# Patient Record
Sex: Male | Born: 2000 | Race: White | Hispanic: Yes | Marital: Single | Smoking: Never smoker
Health system: Southern US, Community
[De-identification: ages and names within clinical notes are randomized; demographics above are authoritative.]

## PROBLEM LIST (undated history)

## (undated) DIAGNOSIS — R109 Unspecified abdominal pain: Secondary | ICD-10-CM

## (undated) DIAGNOSIS — K5900 Constipation, unspecified: Secondary | ICD-10-CM

## (undated) HISTORY — DX: Unspecified abdominal pain: R10.9

## (undated) HISTORY — DX: Constipation, unspecified: K59.00

---
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
## 2018-07-08 IMAGING — CR DG FOOT COMPLETE 3+V*R*
3 series · 3 of 3 positions shown · non-contrast
Comparison: No recent.

CLINICAL DATA: Injury.

EXAM:
RIGHT FOOT COMPLETE - 3+ VIEW

[x foot ap right]
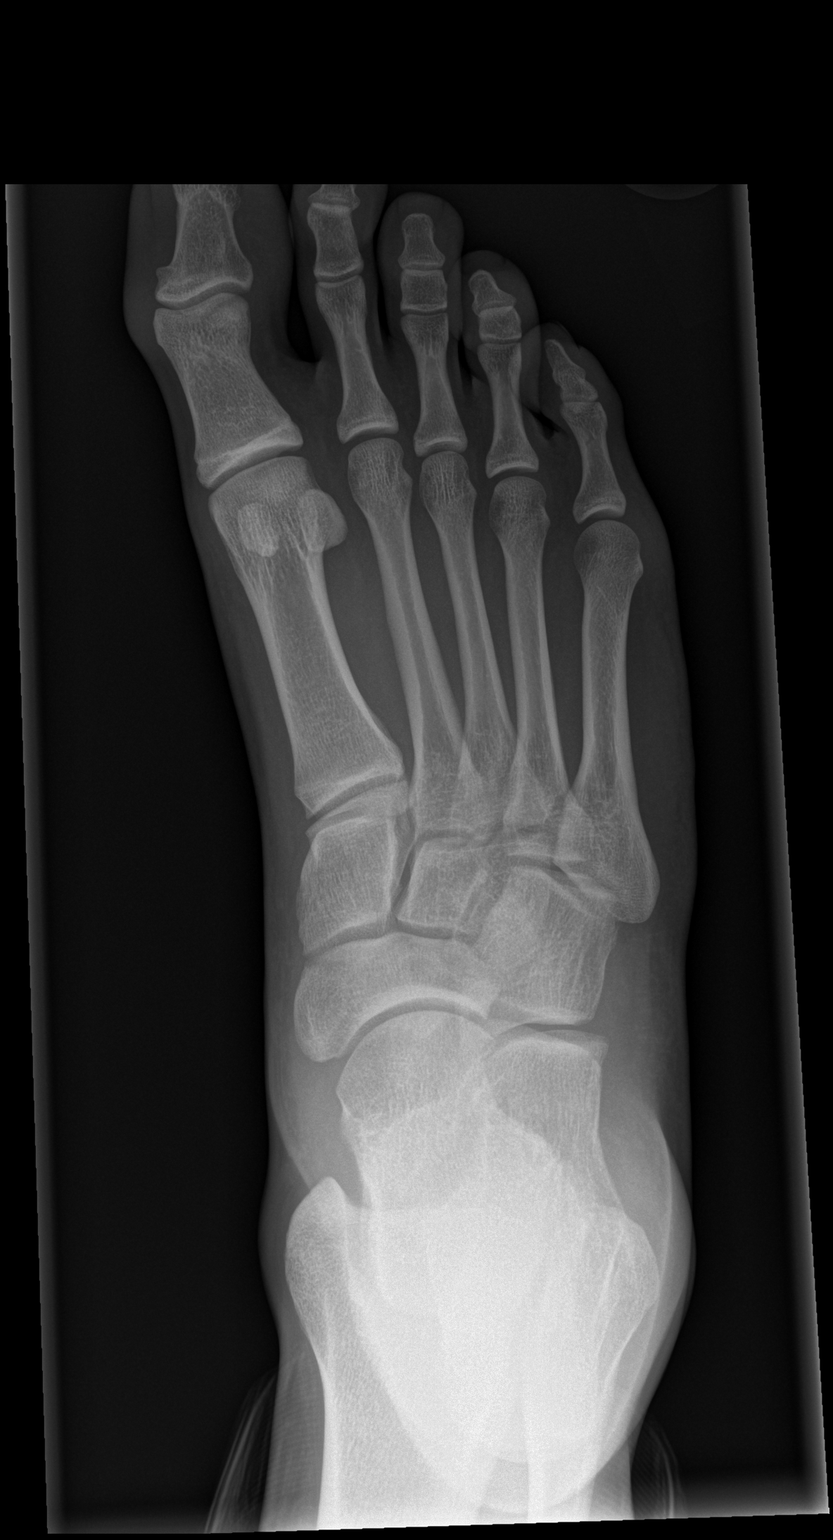

[x foot obl right]
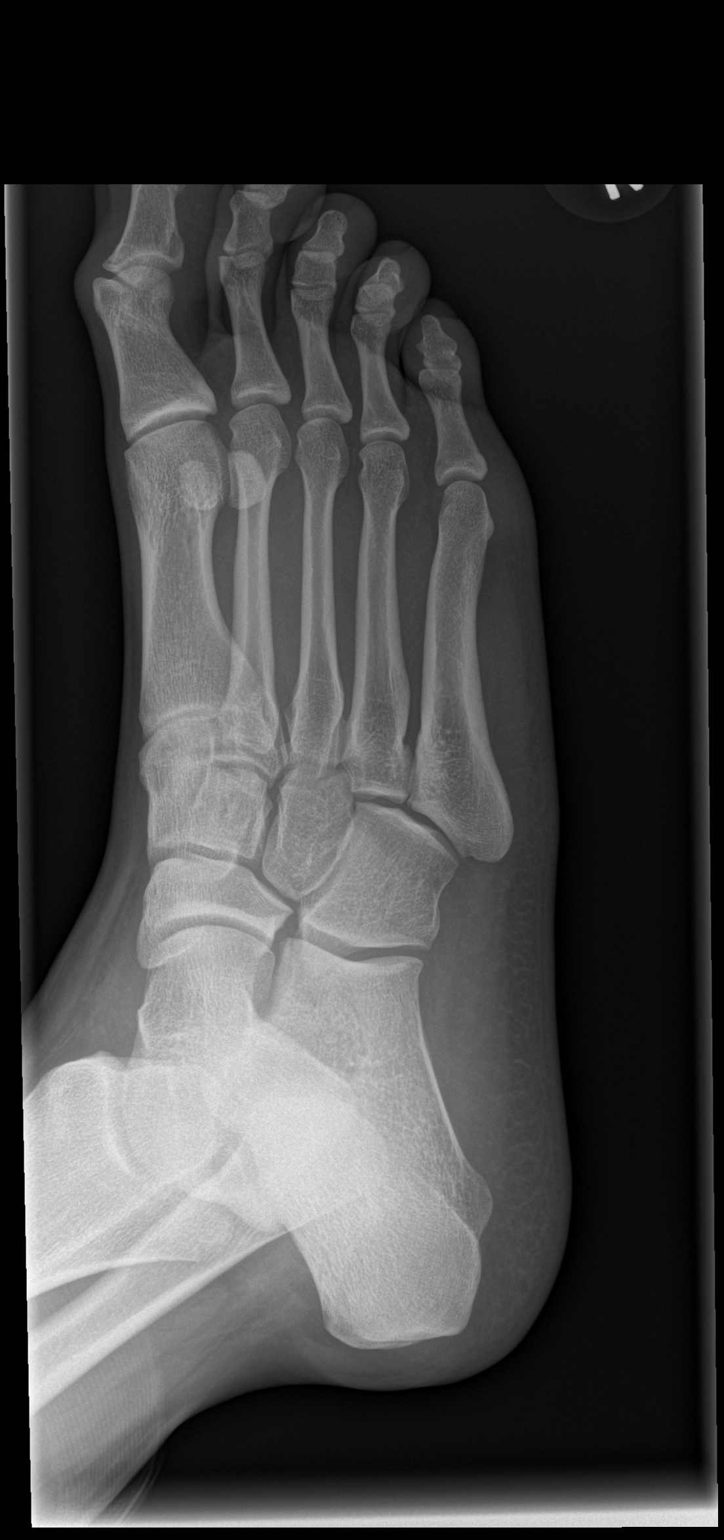

[x foot lat right]
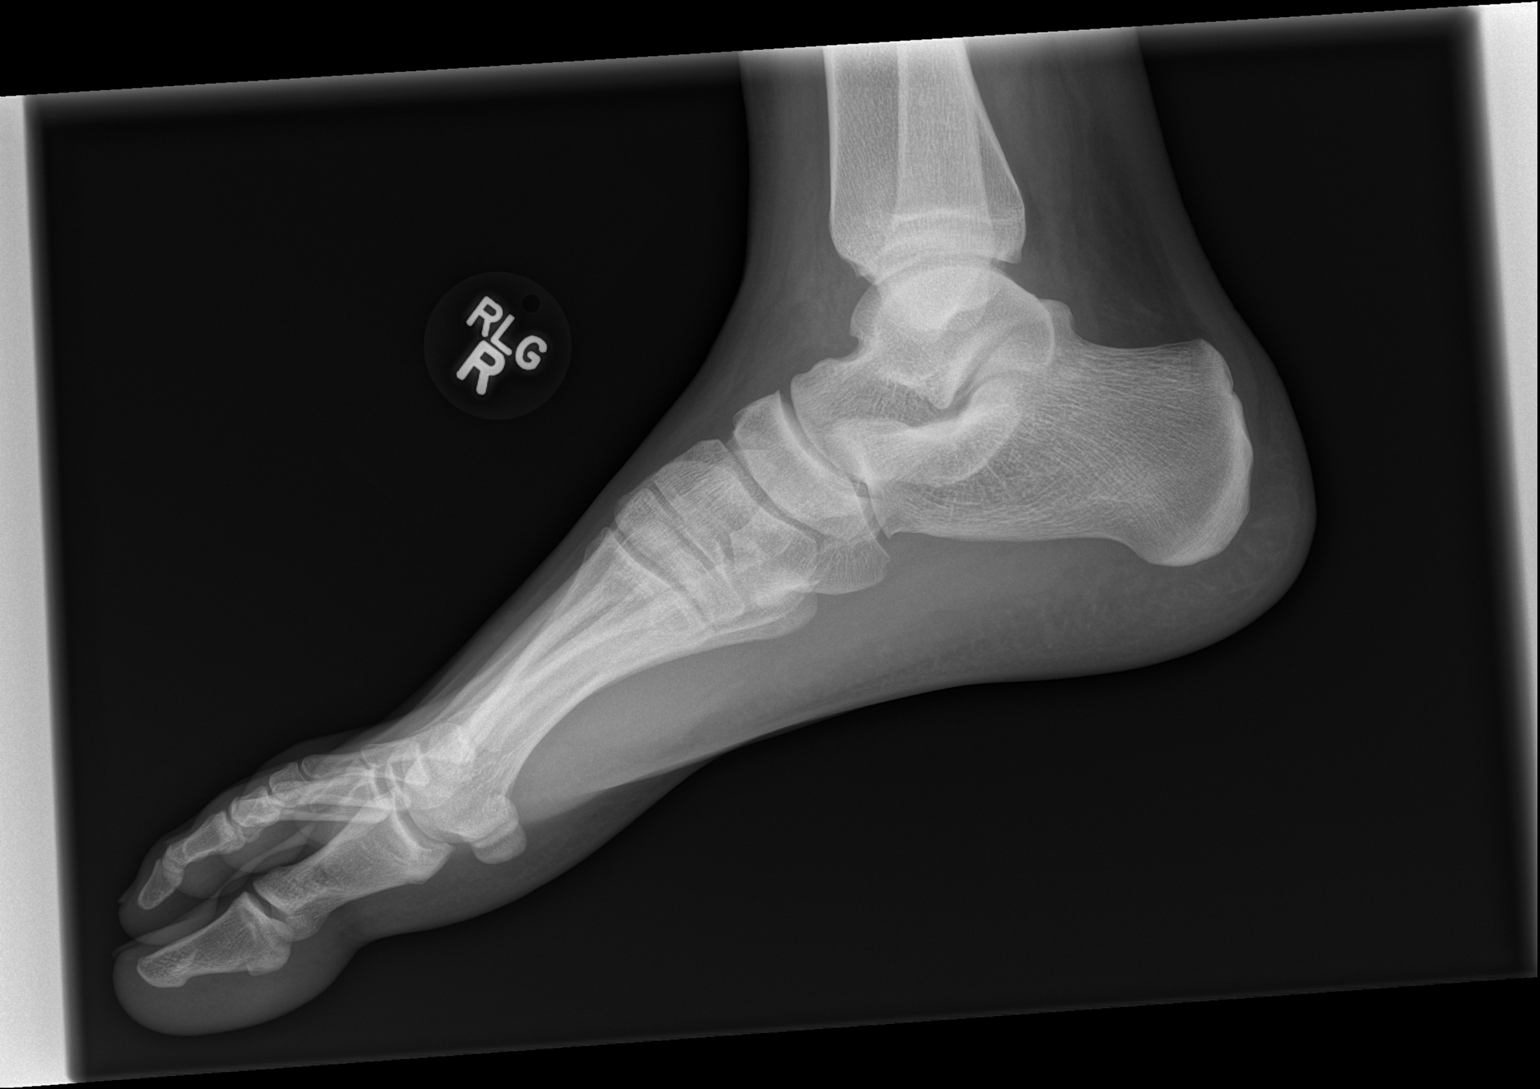

[3 of 3 positions shown; findings below may reference images not displayed]

FINDINGS: No acute bony or joint abnormality identified. No evidence fracture
or dislocation.
IMPRESSION: No acute abnormality.

## 2019-04-05 ENCOUNTER — Other Ambulatory Visit: Payer: Self-pay

## 2019-04-05 DIAGNOSIS — Z20822 Contact with and (suspected) exposure to covid-19: Secondary | ICD-10-CM

## 2019-04-08 LAB — NOVEL CORONAVIRUS, NAA: SARS-CoV-2, NAA: DETECTED — AB

## 2020-10-14 ENCOUNTER — Ambulatory Visit: Payer: Self-pay | Admitting: Medical

## 2021-04-21 ENCOUNTER — Other Ambulatory Visit: Payer: Self-pay

## 2021-04-21 ENCOUNTER — Encounter: Payer: Self-pay | Admitting: Emergency Medicine

## 2021-04-21 ENCOUNTER — Ambulatory Visit (INDEPENDENT_AMBULATORY_CARE_PROVIDER_SITE_OTHER): Payer: 59 | Admitting: Emergency Medicine

## 2021-04-21 VITALS — BP 118/60 | HR 85 | Ht 73.0 in | Wt 199.0 lb

## 2021-04-21 DIAGNOSIS — Z1329 Encounter for screening for other suspected endocrine disorder: Secondary | ICD-10-CM

## 2021-04-21 DIAGNOSIS — Z1159 Encounter for screening for other viral diseases: Secondary | ICD-10-CM

## 2021-04-21 DIAGNOSIS — Z13220 Encounter for screening for lipoid disorders: Secondary | ICD-10-CM

## 2021-04-21 DIAGNOSIS — Z0000 Encounter for general adult medical examination without abnormal findings: Secondary | ICD-10-CM | POA: Diagnosis not present

## 2021-04-21 DIAGNOSIS — Z23 Encounter for immunization: Secondary | ICD-10-CM | POA: Diagnosis not present

## 2021-04-21 DIAGNOSIS — Z130 Encounter for screening for diseases of the blood and blood-forming organs and certain disorders involving the immune mechanism: Secondary | ICD-10-CM

## 2021-04-21 DIAGNOSIS — Z114 Encounter for screening for human immunodeficiency virus [HIV]: Secondary | ICD-10-CM | POA: Diagnosis not present

## 2021-04-21 DIAGNOSIS — M25512 Pain in left shoulder: Secondary | ICD-10-CM

## 2021-04-21 DIAGNOSIS — Z13228 Encounter for screening for other metabolic disorders: Secondary | ICD-10-CM | POA: Diagnosis not present

## 2021-04-21 DIAGNOSIS — R0989 Other specified symptoms and signs involving the circulatory and respiratory systems: Secondary | ICD-10-CM

## 2021-04-21 LAB — CBC WITH DIFFERENTIAL/PLATELET
Basophils Absolute: 0 10*3/uL (ref 0.0–0.1)
Basophils Relative: 0.7 % (ref 0.0–3.0)
Eosinophils Absolute: 0.1 10*3/uL (ref 0.0–0.7)
Eosinophils Relative: 2.8 % (ref 0.0–5.0)
HCT: 40.8 % (ref 39.0–52.0)
Hemoglobin: 13.5 g/dL (ref 13.0–17.0)
Lymphocytes Relative: 34 % (ref 12.0–46.0)
Lymphs Abs: 1.5 10*3/uL (ref 0.7–4.0)
MCHC: 33 g/dL (ref 30.0–36.0)
MCV: 85.4 fl (ref 78.0–100.0)
Monocytes Absolute: 0.4 10*3/uL (ref 0.1–1.0)
Monocytes Relative: 9.2 % (ref 3.0–12.0)
Neutro Abs: 2.3 10*3/uL (ref 1.4–7.7)
Neutrophils Relative %: 53.3 % (ref 43.0–77.0)
Platelets: 148 10*3/uL — ABNORMAL LOW (ref 150.0–400.0)
RBC: 4.78 Mil/uL (ref 4.22–5.81)
RDW: 12.4 % (ref 11.5–15.5)
WBC: 4.3 10*3/uL (ref 4.0–10.5)

## 2021-04-21 LAB — COMPREHENSIVE METABOLIC PANEL
ALT: 15 U/L (ref 0–53)
AST: 17 U/L (ref 0–37)
Albumin: 4.5 g/dL (ref 3.5–5.2)
Alkaline Phosphatase: 77 U/L (ref 39–117)
BUN: 16 mg/dL (ref 6–23)
CO2: 27 mEq/L (ref 19–32)
Calcium: 9.4 mg/dL (ref 8.4–10.5)
Chloride: 104 mEq/L (ref 96–112)
Creatinine, Ser: 0.92 mg/dL (ref 0.40–1.50)
GFR: 117.97 mL/min (ref >60.00)
Glucose, Bld: 88 mg/dL (ref 70–99)
Potassium: 4 mEq/L (ref 3.5–5.1)
Sodium: 139 mEq/L (ref 135–145)
Total Bilirubin: 0.7 mg/dL (ref 0.2–1.2)
Total Protein: 7 g/dL (ref 6.0–8.3)

## 2021-04-21 LAB — HEMOGLOBIN A1C: Hgb A1c MFr Bld: 5.7 % (ref 4.6–6.5)

## 2021-04-21 LAB — LIPID PANEL
Cholesterol: 140 mg/dL (ref 0–200)
HDL: 43.2 mg/dL (ref >39.00)
LDL Cholesterol: 80 mg/dL (ref 0–99)
NonHDL: 97.15
Total CHOL/HDL Ratio: 3
Triglycerides: 87 mg/dL (ref 0.0–149.0)
VLDL: 17.4 mg/dL (ref 0.0–40.0)

## 2021-04-21 LAB — HIV ANTIBODY (ROUTINE TESTING W REFLEX): HIV 1&2 Ab, 4th Generation: NONREACTIVE

## 2021-04-21 LAB — HEPATITIS C ANTIBODY
Hepatitis C Ab: NONREACTIVE
SIGNAL TO CUT-OFF: 0.04 (ref <1.00)

## 2021-04-21 NOTE — Progress Notes (Signed)
Scott Carroll 20 y.o.   Chief Complaint  Patient presents with   New Patient (Initial Visit)    Pt states he would like to discuss having a sore throat constantly. Pt would like to discuss shoulder pain    HISTORY OF PRESENT ILLNESS: This is a 20 y.o. male here for annual exam.  First visit to this office.  Mother is a patient of mine. Also has history of chronic sore throat with occasional throat clearing reflex. Left shoulder pain since injury 3 weeks ago progressively getting better. No other complaints or medical concerns today. Healthy male with a healthy lifestyle. No chronic medical problems.  No chronic medications. Non-smoker.  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  Social History   Socioeconomic History   Marital status: Single    Spouse name: Not on file   Number of children: Not on file   Years of education: Not on file   Highest education level: Not on file  Occupational History   Not on file  Tobacco Use   Smoking status: Never   Smokeless tobacco: Never  Substance and Sexual Activity   Alcohol use: No   Drug use: Not on file   Sexual activity: Not on file  Other Topics Concern   Not on file  Social History Narrative   8th grade   Social Determinants of Health   Financial Resource Strain: Not on file  Food Insecurity: Not on file  Transportation Needs: Not on file  Physical Activity: Not on file  Stress: Not on file  Social Connections: Not on file  Intimate Partner Violence: Not on file    Family History  Problem Relation Age of Onset   Ulcers Father      Review of Systems  Constitutional: Negative.   Negative for chills and fever.  HENT:  Positive for sore throat. Negative for congestion, ear pain and sinus pain.   Eyes: Negative.   Respiratory: Negative.  Negative for cough and shortness of breath.   Cardiovascular: Negative.  Negative for chest pain and palpitations.  Gastrointestinal: Negative.  Negative for abdominal pain, blood in stool, constipation, diarrhea, melena, nausea and vomiting.  Genitourinary: Negative.  Negative for dysuria and hematuria.  Musculoskeletal: Negative.  Negative for myalgias.  Skin: Negative.  Negative for rash.  Neurological: Negative.  Negative for dizziness and headaches.  Psychiatric/Behavioral: Negative.    All other systems reviewed and are negative. Today's Vitals   04/21/21 0907  BP: 118/60  Pulse: 85  SpO2: 96%  Weight: 199 lb (90.3 kg)  Height: 6\' 1"  (1.854 m)   Body mass index is 26.25 kg/m.   Physical Exam Vitals reviewed.  Constitutional:      Appearance: Normal appearance.  HENT:     Head: Normocephalic.     Right Ear: Tympanic membrane, ear canal and external ear normal.     Left Ear: Tympanic membrane, ear canal and external ear normal.     Nose: Nose normal. No congestion.     Mouth/Throat:     Mouth: Mucous membranes are moist.     Pharynx:  Oropharynx is clear. No oropharyngeal exudate or posterior oropharyngeal erythema.  Eyes:     Extraocular Movements: Extraocular movements intact.     Conjunctiva/sclera: Conjunctivae normal.     Pupils: Pupils are equal, round, and reactive to light.  Neck:     Vascular: No carotid bruit.  Cardiovascular:     Rate and Rhythm: Normal rate and regular rhythm.     Pulses: Normal pulses.     Heart sounds: Normal heart sounds.  Pulmonary:     Effort: Pulmonary effort is normal.     Breath sounds: Normal breath sounds.  Abdominal:     General: Bowel sounds are normal. There is no distension.     Palpations: Abdomen is soft. There is no mass.     Tenderness: There is no abdominal  tenderness.  Musculoskeletal:        General: Normal range of motion.     Cervical back: Normal range of motion and neck supple. No tenderness.     Right lower leg: No edema.     Left lower leg: No edema.  Lymphadenopathy:     Cervical: No cervical adenopathy.  Skin:    General: Skin is warm and dry.     Capillary Refill: Capillary refill takes less than 2 seconds.  Neurological:     General: No focal deficit present.     Mental Status: He is alert and oriented to person, place, and time.  Psychiatric:        Mood and Affect: Mood normal.        Behavior: Behavior normal.     ASSESSMENT & PLAN: Problem List Items Addressed This Visit   None Visit Diagnoses     Routine general medical examination at a health care facility    -  Primary   Need for HPV vaccination       Relevant Orders   HPV 9-valent vaccine,Recombinat   Need for hepatitis C screening test       Relevant Orders   Hepatitis C antibody screen   Screening for HIV (human immunodeficiency virus)       Relevant Orders   HIV antibody   Screening for deficiency anemia       Relevant Orders   CBC with Differential   Screening for lipoid disorders       Relevant Orders   Lipid panel   Screening for endocrine, metabolic and immunity disorder       Relevant Orders   Comprehensive metabolic panel   Hemoglobin A1c   Left shoulder pain, unspecified chronicity       Chronic throat clearing          Modifiable risk factors discussed with patient. Anticipatory guidance according to age provided. The following topics were also discussed: Social Determinants of Health Smoking.  Non-smoker Diet and nutrition Benefits of exercise Cancer family history review Vaccinations recommendations in particular HPV vaccination Cardiovascular risk assessment Mental health including depression and anxiety Fall and accident prevention Left shoulder pain management and prognosis  Patient Instructions  Health Maintenance,  Male Adopting a healthy lifestyle and getting preventive care are important in promoting health and wellness. Ask your health care provider about: The right schedule for you to have regular tests and exams. Things you can do on your own to prevent diseases and keep yourself healthy. What should I know about diet, weight, and exercise? Eat a healthy diet  Eat a diet that includes plenty of vegetables, fruits, low-fat dairy products, and lean protein.  Do not eat a lot of foods that are high in solid fats, added sugars, or sodium. Maintain a healthy weight Body mass index (BMI) is a measurement that can be used to identify possible weight problems. It estimates body fat based on height and weight. Your health care provider can help determine your BMI and help you achieve or maintain a healthy weight. Get regular exercise Get regular exercise. This is one of the most important things you can do for your health. Most adults should: Exercise for at least 150 minutes each week. The exercise should increase your heart rate and make you sweat (moderate-intensity exercise). Do strengthening exercises at least twice a week. This is in addition to the moderate-intensity exercise. Spend less time sitting. Even light physical activity can be beneficial. Watch cholesterol and blood lipids Have your blood tested for lipids and cholesterol at 22 years of age, then have this test every 5 years. You may need to have your cholesterol levels checked more often if: Your lipid or cholesterol levels are high. You are older than 22 years of age. You are at high risk for heart disease. What should I know about cancer screening? Many types of cancers can be detected early and may often be prevented. Depending on your health history and family history, you may need to have cancer screening at various ages. This may include screening for: Colorectal cancer. Prostate cancer. Skin cancer. Lung cancer. What should I  know about heart disease, diabetes, and high blood pressure? Blood pressure and heart disease High blood pressure causes heart disease and increases the risk of stroke. This is more likely to develop in people who have high blood pressure readings or are overweight. Talk with your health care provider about your target blood pressure readings. Have your blood pressure checked: Every 3-5 years if you are 55-26 years of age. Every year if you are 8 years old or older. If you are between the ages of 57 and 92 and are a current or former smoker, ask your health care provider if you should have a one-time screening for abdominal aortic aneurysm (AAA). Diabetes Have regular diabetes screenings. This checks your fasting blood sugar level. Have the screening done: Once every three years after age 36 if you are at a normal weight and have a low risk for diabetes. More often and at a younger age if you are overweight or have a high risk for diabetes. What should I know about preventing infection? Hepatitis B If you have a higher risk for hepatitis B, you should be screened for this virus. Talk with your health care provider to find out if you are at risk for hepatitis B infection. Hepatitis C Blood testing is recommended for: Everyone born from [birth year] through 1967. Anyone with known risk factors for hepatitis C. Sexually transmitted infections (STIs) You should be screened each year for STIs, including gonorrhea and chlamydia, if: You are sexually active and are younger than 22 years of age. You are older than 22 years of age and your health care provider tells you that you are at risk for this type of infection. Your sexual activity has changed since you were last screened, and you are at increased risk for chlamydia or gonorrhea. Ask your health care provider if you are at risk. Ask your health care provider about whether you are at high risk for HIV. Your health care provider may recommend a  prescription medicine to help prevent HIV infection. If you choose to  take medicine to prevent HIV, you should first get tested for HIV. You should then be tested every 3 months for as long as you are taking the medicine. Follow these instructions at home: Alcohol use Do not drink alcohol if your health care provider tells you not to drink. If you drink alcohol: Limit how much you have to 0-2 drinks a day. Know how much alcohol is in your drink. In the [place] one drink equals one 12 oz bottle of beer (355 mL), one 5 oz glass of wine (148 mL), or one 1 oz glass of hard liquor (44 mL). Lifestyle Do not use any products that contain nicotine or tobacco. These products include cigarettes, chewing tobacco, and vaping devices, such as e-cigarettes. If you need help quitting, ask your health care provider. Do not use street drugs. Do not share needles. Ask your health care provider for help if you need support or information about quitting drugs. General instructions Schedule regular health, dental, and eye exams. Stay current with your vaccines. Tell your health care provider if: You often feel depressed. You have ever been abused or do not feel safe at home. Summary Adopting a healthy lifestyle and getting preventive care are important in promoting health and wellness. Follow your health care provider's instructions about healthy diet, exercising, and getting tested or screened for diseases. Follow your health care provider's instructions on monitoring your cholesterol and blood pressure. This information is not intended to replace advice given to you by your health care provider. Make sure you discuss any questions you have with your health care provider. Document Revised: 09/28/2020 Document Reviewed: 09/28/2020 Elsevier Patient Education  2022 [employer]       Edwina Barth, MD [facility]

## 2021-04-21 NOTE — Patient Instructions (Signed)

## 2021-08-17 DIAGNOSIS — Z1388 Encounter for screening for disorder due to exposure to contaminants: Secondary | ICD-10-CM | POA: Diagnosis not present

## 2021-08-17 DIAGNOSIS — Z3009 Encounter for other general counseling and advice on contraception: Secondary | ICD-10-CM | POA: Diagnosis not present

## 2021-08-17 DIAGNOSIS — Z0389 Encounter for observation for other suspected diseases and conditions ruled out: Secondary | ICD-10-CM | POA: Diagnosis not present

## 2021-12-02 ENCOUNTER — Encounter: Payer: Self-pay | Admitting: Emergency Medicine

## 2021-12-02 ENCOUNTER — Ambulatory Visit (INDEPENDENT_AMBULATORY_CARE_PROVIDER_SITE_OTHER): Payer: 59 | Admitting: Emergency Medicine

## 2021-12-02 VITALS — BP 110/72 | HR 77 | Temp 98.0°F | Ht 73.0 in | Wt 201.8 lb

## 2021-12-02 DIAGNOSIS — Z13228 Encounter for screening for other metabolic disorders: Secondary | ICD-10-CM

## 2021-12-02 DIAGNOSIS — Z23 Encounter for immunization: Secondary | ICD-10-CM

## 2021-12-02 DIAGNOSIS — Z130 Encounter for screening for diseases of the blood and blood-forming organs and certain disorders involving the immune mechanism: Secondary | ICD-10-CM | POA: Diagnosis not present

## 2021-12-02 DIAGNOSIS — Z0000 Encounter for general adult medical examination without abnormal findings: Secondary | ICD-10-CM | POA: Diagnosis not present

## 2021-12-02 DIAGNOSIS — M25562 Pain in left knee: Secondary | ICD-10-CM

## 2021-12-02 DIAGNOSIS — Z13220 Encounter for screening for lipoid disorders: Secondary | ICD-10-CM

## 2021-12-02 DIAGNOSIS — Z1329 Encounter for screening for other suspected endocrine disorder: Secondary | ICD-10-CM

## 2021-12-02 LAB — COMPREHENSIVE METABOLIC PANEL
ALT: 18 U/L (ref 0–53)
AST: 27 U/L (ref 0–37)
Albumin: 4.6 g/dL (ref 3.5–5.2)
Alkaline Phosphatase: 72 U/L (ref 39–117)
BUN: 16 mg/dL (ref 6–23)
CO2: 28 mEq/L (ref 19–32)
Calcium: 9.2 mg/dL (ref 8.4–10.5)
Chloride: 105 mEq/L (ref 96–112)
Creatinine, Ser: 1.05 mg/dL (ref 0.40–1.50)
GFR: 100.23 mL/min (ref >60.00)
Glucose, Bld: 92 mg/dL (ref 70–99)
Potassium: 4.1 mEq/L (ref 3.5–5.1)
Sodium: 139 mEq/L (ref 135–145)
Total Bilirubin: 0.9 mg/dL (ref 0.2–1.2)
Total Protein: 7 g/dL (ref 6.0–8.3)

## 2021-12-02 LAB — CBC WITH DIFFERENTIAL/PLATELET
Basophils Absolute: 0 10*3/uL (ref 0.0–0.1)
Basophils Relative: 0.5 % (ref 0.0–3.0)
Eosinophils Absolute: 0 10*3/uL (ref 0.0–0.7)
Eosinophils Relative: 0.9 % (ref 0.0–5.0)
HCT: 43.2 % (ref 39.0–52.0)
Hemoglobin: 14.1 g/dL (ref 13.0–17.0)
Lymphocytes Relative: 30.2 % (ref 12.0–46.0)
Lymphs Abs: 1.5 10*3/uL (ref 0.7–4.0)
MCHC: 32.6 g/dL (ref 30.0–36.0)
MCV: 87.6 fl (ref 78.0–100.0)
Monocytes Absolute: 0.5 10*3/uL (ref 0.1–1.0)
Monocytes Relative: 9.3 % (ref 3.0–12.0)
Neutro Abs: 2.9 10*3/uL (ref 1.4–7.7)
Neutrophils Relative %: 59.1 % (ref 43.0–77.0)
Platelets: 153 10*3/uL (ref 150.0–400.0)
RBC: 4.93 Mil/uL (ref 4.22–5.81)
RDW: 13 % (ref 11.5–15.5)
WBC: 4.9 10*3/uL (ref 4.0–10.5)

## 2021-12-02 LAB — LIPID PANEL
Cholesterol: 129 mg/dL (ref 0–200)
HDL: 42.4 mg/dL (ref >39.00)
LDL Cholesterol: 76 mg/dL (ref 0–99)
NonHDL: 86.95
Total CHOL/HDL Ratio: 3
Triglycerides: 56 mg/dL (ref 0.0–149.0)
VLDL: 11.2 mg/dL (ref 0.0–40.0)

## 2021-12-02 LAB — HEMOGLOBIN A1C: Hgb A1c MFr Bld: 5.7 % (ref 4.6–6.5)

## 2021-12-02 NOTE — Progress Notes (Signed)
Scott Carroll 21 y.o.   Chief Complaint  Patient presents with   Annual Exam    HISTORY OF PRESENT ILLNESS: This is a 21 y.o. male here for annual exam. Healthy male with a healthy lifestyle Chronic left knee pain No other complaints or medical concerns today.  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  Social History   Socioeconomic History   Marital status: Single    Spouse name: Not on file   Number of children: Not on file   Years of education: Not on file   Highest education level: Not on file  Occupational History   Not on file  Tobacco Use   Smoking status: Never   Smokeless tobacco: Never  Substance and Sexual Activity   Alcohol use: No   Drug use: Not on file   Sexual activity: Not on file  Other Topics Concern   Not on file  Social History Narrative   8th grade   Social Determinants of Health   Financial Resource Strain: Not on file  Food Insecurity: Not on file  Transportation Needs: Not on file  Physical Activity: Not on file  Stress: Not on file  Social Connections: Not on file  Intimate Partner Violence: Not on file    Family History  Problem Relation Age of Onset   Ulcers Father      Review of Systems  Constitutional: Negative.  Negative for chills and fever.  HENT: Negative.  Negative for congestion and sore throat.   Respiratory: Negative.  Negative for cough and shortness of breath.   Cardiovascular: Negative.  Negative for chest pain and palpitations.  Gastrointestinal:  Negative for abdominal pain, diarrhea, nausea and vomiting.  Genitourinary: Negative.   Musculoskeletal:  Positive for joint pain  (Chronic left knee pain).  Skin: Negative.  Negative for rash.  Neurological:  Negative for dizziness and headaches.  All other systems reviewed and are negative.  Today's Vitals   12/02/21 0829  BP: 110/72  Pulse: 77  Temp: 98 F (36.7 C)  TempSrc: Oral  SpO2: 97%  Weight: 201 lb 12.8 oz (91.5 kg)  Height: 6\' 1"  (1.854 m)   Body mass index is 26.62 kg/m.   Physical Exam Vitals reviewed.  Constitutional:      Appearance: Normal appearance.  HENT:     Head: Normocephalic.     Right Ear: Tympanic membrane, ear canal and external ear normal.     Left Ear: Tympanic membrane, ear canal and external ear normal.     Mouth/Throat:     Mouth: Mucous membranes are moist.     Pharynx: Oropharynx is clear.  Eyes:     Extraocular Movements: Extraocular movements intact.     Conjunctiva/sclera: Conjunctivae normal.     Pupils: Pupils are equal, round, and reactive to light.  Cardiovascular:     Rate and Rhythm: Normal rate and regular rhythm.     Pulses: Normal pulses.     Heart sounds: Normal heart sounds.  Pulmonary:     Effort: Pulmonary effort is normal.     Breath sounds: Normal breath sounds.  Abdominal:     General: There is no distension.     Palpations: Abdomen is soft.     Tenderness: There is no abdominal tenderness.  Musculoskeletal:     Cervical back: No tenderness.     Right lower leg: No edema.     Left lower leg: No edema.     Comments: Left knee: No erythema.  Full range of motion.  No tenderness or significant swelling.  Lymphadenopathy:     Cervical: No cervical adenopathy.  Skin:    General: Skin is warm and dry.     Capillary Refill: Capillary refill takes less than 2 seconds.  Neurological:     General: No focal deficit present.     Mental Status: He is alert and oriented to person, place, and time.  Psychiatric:        Mood and Affect: Mood normal.        Behavior: Behavior normal.      ASSESSMENT & PLAN: Problem List Items Addressed This  Visit   None Visit Diagnoses     Routine general medical examination at a health care facility    -  Primary   Left knee pain, unspecified chronicity       Relevant Orders   Ambulatory referral to Sports Medicine   Need for HPV vaccination       Screening for deficiency anemia       Relevant Orders   CBC with Differential   Screening for lipoid disorders       Relevant Orders   Lipid panel   Screening for endocrine, metabolic and immunity disorder       Relevant Orders   Comprehensive metabolic panel   Hemoglobin A1c   Need for HPV vaccine       Relevant Orders   HPV 9-valent vaccine,Recombinat (Completed)      Modifiable risk factors discussed with patient. Anticipatory guidance according to age provided. The following topics were also discussed: Social Determinants of Health Smoking.  Non-smoker Diet and nutrition and need to decrease amount of daily carbohydrate intake Benefits of exercise Cancer family history review Vaccinations recommendations in particular HPV vaccine Cardiovascular risk assessment Mental health including depression and anxiety Fall and accident prevention Chronic left knee pain secondary to the injury in the past and need for sports medicine evaluation.  Patient Instructions  Health Maintenance, Male Adopting a healthy lifestyle and getting preventive care are important in promoting health and wellness. Ask your health care provider about: The right schedule for you to have regular tests and exams. Things you can do on your own to prevent diseases and keep yourself healthy. What should I know about diet, weight, and exercise? Eat a healthy diet  Eat a diet that includes plenty of vegetables, fruits, low-fat dairy products, and lean protein. Do not eat a lot of foods that are high in solid fats, added sugars, or sodium. Maintain a healthy weight Body mass index (BMI) is a measurement that can be used to identify possible weight problems. It  estimates body fat based on height and weight. Your health care provider can help determine your BMI and help you achieve or maintain a healthy weight. Get regular exercise Get regular exercise. This is one of the most important things you can do for your health. Most adults should: Exercise for at least 150 minutes each week. The exercise should increase your heart rate and make you sweat (moderate-intensity exercise). Do strengthening exercises at least twice a week. This  is in addition to the moderate-intensity exercise. Spend less time sitting. Even light physical activity can be beneficial. Watch cholesterol and blood lipids Have your blood tested for lipids and cholesterol at 23 years of age, then have this test every 5 years. You may need to have your cholesterol levels checked more often if: Your lipid or cholesterol levels are high. You are older than 23 years of age. You are at high risk for heart disease. What should I know about cancer screening? Many types of cancers can be detected early and may often be prevented. Depending on your health history and family history, you may need to have cancer screening at various ages. This may include screening for: Colorectal cancer. Prostate cancer. Skin cancer. Lung cancer. What should I know about heart disease, diabetes, and high blood pressure? Blood pressure and heart disease High blood pressure causes heart disease and increases the risk of stroke. This is more likely to develop in people who have high blood pressure readings or are overweight. Talk with your health care provider about your target blood pressure readings. Have your blood pressure checked: Every 3-5 years if you are 82-50 years of age. Every year if you are 18 years old or older. If you are between the ages of 58 and 80 and are a current or former smoker, ask your health care provider if you should have a one-time screening for abdominal aortic aneurysm  (AAA). Diabetes Have regular diabetes screenings. This checks your fasting blood sugar level. Have the screening done: Once every three years after age 31 if you are at a normal weight and have a low risk for diabetes. More often and at a younger age if you are overweight or have a high risk for diabetes. What should I know about preventing infection? Hepatitis B If you have a higher risk for hepatitis B, you should be screened for this virus. Talk with your health care provider to find out if you are at risk for hepatitis B infection. Hepatitis C Blood testing is recommended for: Everyone born from [birth year] through 1965. Anyone with known risk factors for hepatitis C. Sexually transmitted infections (STIs) You should be screened each year for STIs, including gonorrhea and chlamydia, if: You are sexually active and are younger than 23 years of age. You are older than 23 years of age and your health care provider tells you that you are at risk for this type of infection. Your sexual activity has changed since you were last screened, and you are at increased risk for chlamydia or gonorrhea. Ask your health care provider if you are at risk. Ask your health care provider about whether you are at high risk for HIV. Your health care provider may recommend a prescription medicine to help prevent HIV infection. If you choose to take medicine to prevent HIV, you should first get tested for HIV. You should then be tested every 3 months for as long as you are taking the medicine. Follow these instructions at home: Alcohol use Do not drink alcohol if your health care provider tells you not to drink. If you drink alcohol: Limit how much you have to 0-2 drinks a day. Know how much alcohol is in your drink. In the [place] one drink equals one 12 oz bottle of beer (355 mL), one 5 oz glass of wine (148 mL), or one 1 oz glass of hard liquor (44 mL). Lifestyle Do not use any products that contain nicotine or  tobacco. These products  include cigarettes, chewing tobacco, and vaping devices, such as e-cigarettes. If you need help quitting, ask your health care provider. Do not use street drugs. Do not share needles. Ask your health care provider for help if you need support or information about quitting drugs. General instructions Schedule regular health, dental, and eye exams. Stay current with your vaccines. Tell your health care provider if: You often feel depressed. You have ever been abused or do not feel safe at home. Summary Adopting a healthy lifestyle and getting preventive care are important in promoting health and wellness. Follow your health care provider's instructions about healthy diet, exercising, and getting tested or screened for diseases. Follow your health care provider's instructions on monitoring your cholesterol and blood pressure. This information is not intended to replace advice given to you by your health care provider. Make sure you discuss any questions you have with your health care provider. Document Revised: 09/28/2020 Document Reviewed: 09/28/2020 Elsevier Patient Education  2023 Elsevier Inc.      Edwina Barth, MD [facility]

## 2021-12-02 NOTE — Patient Instructions (Signed)
Health Maintenance, Male Adopting a healthy lifestyle and getting preventive care are important in promoting health and wellness. Ask your health care provider about: The right schedule for you to have regular tests and exams. Things you can do on your own to prevent diseases and keep yourself healthy. What should I know about diet, weight, and exercise? Eat a healthy diet  Eat a diet that includes plenty of vegetables, fruits, low-fat dairy products, and lean protein. Do not eat a lot of foods that are high in solid fats, added sugars, or sodium. Maintain a healthy weight Body mass index (BMI) is a measurement that can be used to identify possible weight problems. It estimates body fat based on height and weight. Your health care provider can help determine your BMI and help you achieve or maintain a healthy weight. Get regular exercise Get regular exercise. This is one of the most important things you can do for your health. Most adults should: Exercise for at least 150 minutes each week. The exercise should increase your heart rate and make you sweat (moderate-intensity exercise). Do strengthening exercises at least twice a week. This is in addition to the moderate-intensity exercise. Spend less time sitting. Even light physical activity can be beneficial. Watch cholesterol and blood lipids Have your blood tested for lipids and cholesterol at 23 years of age, then have this test every 5 years. You may need to have your cholesterol levels checked more often if: Your lipid or cholesterol levels are high. You are older than 23 years of age. You are at high risk for heart disease. What should I know about cancer screening? Many types of cancers can be detected early and may often be prevented. Depending on your health history and family history, you may need to have cancer screening at various ages. This may include screening for: Colorectal cancer. Prostate cancer. Skin cancer. Lung  cancer. What should I know about heart disease, diabetes, and high blood pressure? Blood pressure and heart disease High blood pressure causes heart disease and increases the risk of stroke. This is more likely to develop in people who have high blood pressure readings or are overweight. Talk with your health care provider about your target blood pressure readings. Have your blood pressure checked: Every 3-5 years if you are 18-39 years of age. Every year if you are 40 years old or older. If you are between the ages of 65 and 75 and are a current or former smoker, ask your health care provider if you should have a one-time screening for abdominal aortic aneurysm (AAA). Diabetes Have regular diabetes screenings. This checks your fasting blood sugar level. Have the screening done: Once every three years after age 45 if you are at a normal weight and have a low risk for diabetes. More often and at a younger age if you are overweight or have a high risk for diabetes. What should I know about preventing infection? Hepatitis B If you have a higher risk for hepatitis B, you should be screened for this virus. Talk with your health care provider to find out if you are at risk for hepatitis B infection. Hepatitis C Blood testing is recommended for: Everyone born from 1945 through 1965. Anyone with known risk factors for hepatitis C. Sexually transmitted infections (STIs) You should be screened each year for STIs, including gonorrhea and chlamydia, if: You are sexually active and are younger than 24 years of age. You are older than 24 years of age and your   health care provider tells you that you are at risk for this type of infection. Your sexual activity has changed since you were last screened, and you are at increased risk for chlamydia or gonorrhea. Ask your health care provider if you are at risk. Ask your health care provider about whether you are at high risk for HIV. Your health care provider  may recommend a prescription medicine to help prevent HIV infection. If you choose to take medicine to prevent HIV, you should first get tested for HIV. You should then be tested every 3 months for as long as you are taking the medicine. Follow these instructions at home: Alcohol use Do not drink alcohol if your health care provider tells you not to drink. If you drink alcohol: Limit how much you have to 0-2 drinks a day. Know how much alcohol is in your drink. In the [place] one drink equals one 12 oz bottle of beer (355 mL), one 5 oz glass of wine (148 mL), or one 1 oz glass of hard liquor (44 mL). Lifestyle Do not use any products that contain nicotine or tobacco. These products include cigarettes, chewing tobacco, and vaping devices, such as e-cigarettes. If you need help quitting, ask your health care provider. Do not use street drugs. Do not share needles. Ask your health care provider for help if you need support or information about quitting drugs. General instructions Schedule regular health, dental, and eye exams. Stay current with your vaccines. Tell your health care provider if: You often feel depressed. You have ever been abused or do not feel safe at home. Summary Adopting a healthy lifestyle and getting preventive care are important in promoting health and wellness. Follow your health care provider's instructions about healthy diet, exercising, and getting tested or screened for diseases. Follow your health care provider's instructions on monitoring your cholesterol and blood pressure. This information is not intended to replace advice given to you by your health care provider. Make sure you discuss any questions you have with your health care provider. Document Revised: 09/28/2020 Document Reviewed: 09/28/2020 Elsevier Patient Education  2023 Elsevier Inc.  

## 2021-12-06 NOTE — Progress Notes (Unsigned)
    Aleen Sells D.[facility] [address] [place] [address] Phone: [phone]   Assessment and Plan:     There are no diagnoses linked to this encounter.  ***   Pertinent previous records reviewed include ***   Follow Up: ***     Subjective:   I, Aailyah Dunbar, am serving as a Neurosurgeon for Doctor Richardean Sale  Chief Complaint: left knee pain   HPI:   12/07/2021 Patient is  21 year old male complaining of left knee pain. Patient states   Relevant Historical Information: ***  Additional pertinent review of systems negative.  No current outpatient medications on file.   Objective:     There were no vitals filed for this visit.    There is no height or weight on file to calculate BMI.    Physical Exam:    ***   Electronically signed by:  Aleen Sells D.[facility] 2:00 PM 12/06/21

## 2021-12-07 ENCOUNTER — Ambulatory Visit: Payer: 59 | Admitting: Sports Medicine

## 2021-12-07 VITALS — BP 118/80 | HR 76 | Ht 73.0 in | Wt 204.0 lb

## 2021-12-07 DIAGNOSIS — M25562 Pain in left knee: Secondary | ICD-10-CM | POA: Diagnosis not present

## 2021-12-07 DIAGNOSIS — M7652 Patellar tendinitis, left knee: Secondary | ICD-10-CM

## 2021-12-07 MED ORDER — MELOXICAM 15 MG PO TABS: 15.0000 mg | ORAL_TABLET | Freq: Every day | ORAL | 0 refills | Status: AC

## 2021-12-07 NOTE — Patient Instructions (Addendum)
Good to see you  Knee HEP - Start meloxicam 15 mg daily x2 weeks.  If still having pain after 2 weeks, complete 3rd-week of meloxicam. May use remaining meloxicam as needed once daily for pain control.  Do not to use additional NSAIDs while taking meloxicam.  May use Tylenol [id number] mg 2 to 3 times a day for breakthrough pain. Recommend getting a patellar knee strap to wear when physically active  As needed follow up , if no improvement 4 week follow up

## 2022-01-10 ENCOUNTER — Other Ambulatory Visit: Payer: Self-pay | Admitting: Sports Medicine

## 2022-02-09 ENCOUNTER — Ambulatory Visit: Payer: Medicaid Other

## 2022-07-13 ENCOUNTER — Ambulatory Visit: Payer: Medicaid Other | Admitting: Emergency Medicine

## 2022-08-29 ENCOUNTER — Encounter (HOSPITAL_COMMUNITY): Payer: Self-pay

## 2022-08-29 ENCOUNTER — Emergency Department (HOSPITAL_COMMUNITY): Payer: Medicaid Other

## 2022-08-29 ENCOUNTER — Emergency Department (HOSPITAL_COMMUNITY)
Admission: EM | Admit: 2022-08-29 | Discharge: 2022-08-29 | Disposition: A | Discharge status: Home / Self Care | Payer: Medicaid Other | Attending: Emergency Medicine | Admitting: Emergency Medicine

## 2022-08-29 ENCOUNTER — Other Ambulatory Visit: Payer: Self-pay

## 2022-08-29 DIAGNOSIS — S060X0A Concussion without loss of consciousness, initial encounter: Secondary | ICD-10-CM | POA: Diagnosis not present

## 2022-08-29 DIAGNOSIS — R0789 Other chest pain: Secondary | ICD-10-CM | POA: Diagnosis not present

## 2022-08-29 DIAGNOSIS — M25511 Pain in right shoulder: Secondary | ICD-10-CM | POA: Diagnosis not present

## 2022-08-29 DIAGNOSIS — S3991XA Unspecified injury of abdomen, initial encounter: Secondary | ICD-10-CM | POA: Diagnosis not present

## 2022-08-29 DIAGNOSIS — Z1152 Encounter for screening for COVID-19: Secondary | ICD-10-CM | POA: Diagnosis not present

## 2022-08-29 DIAGNOSIS — M25521 Pain in right elbow: Secondary | ICD-10-CM | POA: Diagnosis not present

## 2022-08-29 DIAGNOSIS — S161XXA Strain of muscle, fascia and tendon at neck level, initial encounter: Secondary | ICD-10-CM | POA: Diagnosis not present

## 2022-08-29 DIAGNOSIS — Z041 Encounter for examination and observation following transport accident: Secondary | ICD-10-CM | POA: Diagnosis not present

## 2022-08-29 DIAGNOSIS — S299XXA Unspecified injury of thorax, initial encounter: Secondary | ICD-10-CM | POA: Insufficient documentation

## 2022-08-29 DIAGNOSIS — Y907 Blood alcohol level of 200-239 mg/100 ml: Secondary | ICD-10-CM | POA: Diagnosis not present

## 2022-08-29 DIAGNOSIS — R000 Tachycardia, unspecified: Secondary | ICD-10-CM | POA: Diagnosis not present

## 2022-08-29 DIAGNOSIS — S0003XA Contusion of scalp, initial encounter: Secondary | ICD-10-CM | POA: Diagnosis not present

## 2022-08-29 DIAGNOSIS — S060XAA Concussion with loss of consciousness status unknown, initial encounter: Secondary | ICD-10-CM

## 2022-08-29 DIAGNOSIS — Y92410 Unspecified street and highway as the place of occurrence of the external cause: Secondary | ICD-10-CM | POA: Diagnosis not present

## 2022-08-29 DIAGNOSIS — S298XXA Other specified injuries of thorax, initial encounter: Secondary | ICD-10-CM

## 2022-08-29 DIAGNOSIS — M542 Cervicalgia: Secondary | ICD-10-CM | POA: Diagnosis not present

## 2022-08-29 LAB — COMPREHENSIVE METABOLIC PANEL
ALT: 35 U/L (ref 0–44)
AST: 33 U/L (ref 15–41)
Albumin: 4.1 g/dL (ref 3.5–5.0)
Alkaline Phosphatase: 76 U/L (ref 38–126)
Anion gap: 15 (ref 5–15)
BUN: 8 mg/dL (ref 6–20)
CO2: 22 mmol/L (ref 22–32)
Calcium: 8.4 mg/dL — ABNORMAL LOW (ref 8.9–10.3)
Chloride: 105 mmol/L (ref 98–111)
Creatinine, Ser: 0.92 mg/dL (ref 0.61–1.24)
GFR, Estimated: >60 mL/min (ref >60)
Glucose, Bld: 110 mg/dL — ABNORMAL HIGH (ref 70–99)
Potassium: 4.1 mmol/L (ref 3.5–5.1)
Sodium: 142 mmol/L (ref 135–145)
Total Bilirubin: 0.3 mg/dL (ref 0.3–1.2)
Total Protein: 7.1 g/dL (ref 6.5–8.1)

## 2022-08-29 LAB — PROTIME-INR
INR: 0.9 (ref 0.8–1.2)
Prothrombin Time: 12.4 seconds (ref 11.4–15.2)

## 2022-08-29 LAB — RESP PANEL BY RT-PCR (RSV, FLU A&B, COVID)  RVPGX2
Influenza A by PCR: NEGATIVE
Influenza B by PCR: NEGATIVE
Resp Syncytial Virus by PCR: NEGATIVE
SARS Coronavirus 2 by RT PCR: NEGATIVE

## 2022-08-29 LAB — CBC
HCT: 46.6 % (ref 39.0–52.0)
Hemoglobin: 14.9 g/dL (ref 13.0–17.0)
MCH: 28.8 pg (ref 26.0–34.0)
MCHC: 32 g/dL (ref 30.0–36.0)
MCV: 90 fL (ref 80.0–100.0)
Platelets: 188 10*3/uL (ref 150–400)
RBC: 5.18 MIL/uL (ref 4.22–5.81)
RDW: 12.7 % (ref 11.5–15.5)
WBC: 6.4 10*3/uL (ref 4.0–10.5)
nRBC: 0 % (ref 0.0–0.2)

## 2022-08-29 LAB — ETHANOL: Alcohol, Ethyl (B): 218 mg/dL — ABNORMAL HIGH (ref <10)

## 2022-08-29 LAB — SAMPLE TO BLOOD BANK

## 2022-08-29 LAB — I-STAT CHEM 8, ED
BUN: 7 mg/dL (ref 6–20)
Calcium, Ion: 1.04 mmol/L — ABNORMAL LOW (ref 1.15–1.40)
Chloride: 109 mmol/L (ref 98–111)
Creatinine, Ser: 1.2 mg/dL (ref 0.61–1.24)
Glucose, Bld: 109 mg/dL — ABNORMAL HIGH (ref 70–99)
HCT: 49 % (ref 39.0–52.0)
Hemoglobin: 16.7 g/dL (ref 13.0–17.0)
Potassium: 4 mmol/L (ref 3.5–5.1)
Sodium: 145 mmol/L (ref 135–145)
TCO2: 23 mmol/L (ref 22–32)

## 2022-08-29 LAB — CK: Total CK: 310 U/L (ref 49–397)

## 2022-08-29 MED ORDER — SODIUM CHLORIDE 0.9 % IV BOLUS (SEPSIS)
2000.0000 mL | Freq: Once | INTRAVENOUS | Status: AC
  Administered 2022-08-29: 2000 mL via INTRAVENOUS

## 2022-08-29 MED ORDER — IOHEXOL 350 MG/ML SOLN
75.0000 mL | Freq: Once | INTRAVENOUS | Status: AC | PRN
  Administered 2022-08-29: 75 mL via INTRAVENOUS
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
## 2022-08-29 NOTE — ED Notes (Signed)
Patient returned from CT at this time.

## 2022-08-29 NOTE — ED Notes (Signed)
Patient transported to CT 
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
## 2022-08-29 NOTE — Progress Notes (Signed)
Orthopedic Tech Progress Note Patient Details:  Scott Carroll December 30, 2000 [id number]  Patient ID: Scott Carroll, male   DOB: 2001-01-16, 22 y.o.   MRN: [id number] I attended trauma page. Trinna Post 08/29/2022, 5:17 AM

## 2023-01-10 DIAGNOSIS — Z202 Contact with and (suspected) exposure to infections with a predominantly sexual mode of transmission: Secondary | ICD-10-CM | POA: Diagnosis not present

## 2023-07-06 DIAGNOSIS — M25561 Pain in right knee: Secondary | ICD-10-CM | POA: Diagnosis not present

## 2023-07-17 DIAGNOSIS — M25561 Pain in right knee: Secondary | ICD-10-CM | POA: Diagnosis not present

## 2023-07-26 DIAGNOSIS — J029 Acute pharyngitis, unspecified: Secondary | ICD-10-CM | POA: Diagnosis not present

## 2023-08-03 DIAGNOSIS — M25561 Pain in right knee: Secondary | ICD-10-CM | POA: Diagnosis not present

## 2023-10-24 DIAGNOSIS — A5600 Chlamydial infection of lower genitourinary tract, unspecified: Secondary | ICD-10-CM | POA: Diagnosis not present

## 2024-01-30 ENCOUNTER — Ambulatory Visit: Payer: Self-pay | Admitting: Emergency Medicine

## 2024-01-30 ENCOUNTER — Ambulatory Visit (INDEPENDENT_AMBULATORY_CARE_PROVIDER_SITE_OTHER)

## 2024-01-30 ENCOUNTER — Encounter: Payer: Self-pay | Admitting: Emergency Medicine

## 2024-01-30 ENCOUNTER — Ambulatory Visit: Admitting: Emergency Medicine

## 2024-01-30 VITALS — BP 112/78 | HR 81 | Temp 98.2°F | Ht 73.0 in | Wt 180.0 lb

## 2024-01-30 DIAGNOSIS — Z113 Encounter for screening for infections with a predominantly sexual mode of transmission: Secondary | ICD-10-CM | POA: Diagnosis not present

## 2024-01-30 DIAGNOSIS — S39012A Strain of muscle, fascia and tendon of lower back, initial encounter: Secondary | ICD-10-CM

## 2024-01-30 DIAGNOSIS — M545 Low back pain, unspecified: Secondary | ICD-10-CM | POA: Insufficient documentation

## 2024-01-30 NOTE — Assessment & Plan Note (Signed)
 Pain management discussed Recommend Tylenol and or Advil as needed Clinically stable.  No red flag signs or symptoms Negative x-rays May need orthopedic evaluation and or physical therapy.

## 2024-01-30 NOTE — Assessment & Plan Note (Signed)
 Clinically stable.  No red flag signs or symptoms. Most likely secondary to exercise regimen Musculoskeletal pain Pain management discussed Unremarkable x-rays May need orthopedic evaluation if no better or worse during the next several weeks.

## 2024-01-30 NOTE — Progress Notes (Signed)
 Scott Carroll 23 y.o.   Chief Complaint  Patient presents with   Back Pain    Patient here for lower back pain. Patients it has been going on for about 2 weeks, he states when he is looking down is when he feels it the most. States it not from an injury. No numbness. He is not taking any medications for the pain. Patient mentions would like a STI testing because he has a new partner     HISTORY OF PRESENT ILLNESS: This is a 23 y.o. male complaining of lumbar pain that started 2 weeks ago Sharp constant pain without radiation worse with movement and not associated with any other symptoms Denies bowel or bladder symptoms Also requesting STD testing because he has a new partner.  Asymptomatic. No other complaints or medical concerns today.  Back Pain Pertinent negatives include no abdominal pain, chest pain, dysuria, fever or headaches.     Prior to Admission medications   Medication Sig Start Date End Date Taking? Authorizing Provider  meloxicam  (MOBIC ) 15 MG tablet Take 1 tablet (15 mg total) by mouth daily. 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 No pertinent surgical history.  Social History   Socioeconomic History   Marital status: Single    Spouse name: Not on file   Number of children: Not on file   Years of education: Not on file   Highest education level: Not on file  Occupational History   Not on file  Tobacco Use   Smoking status: Never   Smokeless tobacco: Never  Substance and Sexual Activity   Alcohol use: No   Drug use: Never   Sexual activity: Yes  Other Topics Concern   Not on file  Social History Narrative   8th grade    Social Drivers of Health   Financial Resource Strain: Not on file  Food Insecurity: Not on file  Transportation Needs: Not on file  Physical Activity: Not on file  Stress: Not on file  Social Connections: Not on file  Intimate Partner Violence: Unknown (08/27/2021)   Received from [facility]   HITS    Physically Hurt: Not on file    Insult or Talk Down To: Not on file    Threaten Physical Harm: Not on file    Scream or Curse: Not on file    Family History  Problem Relation Age of Onset   Ulcers Father      Review of Systems  Constitutional: Negative.  Negative for chills and fever.  HENT: Negative.  Negative for congestion and sore throat.   Respiratory: Negative.  Negative for cough and shortness of breath.   Cardiovascular: Negative.  Negative for chest pain and palpitations.  Gastrointestinal:  Negative for abdominal pain, diarrhea, nausea and vomiting.  Genitourinary: Negative.  Negative for dysuria and hematuria.  Musculoskeletal:  Positive for back pain.  Skin: Negative.  Negative for rash.  Neurological: Negative.  Negative for dizziness and headaches.  All other systems reviewed and are negative.   Vitals:   01/30/24 0937  BP: 112/78  Pulse: 81  Temp: 98.2 F (36.8 C)  SpO2: 99%    Physical Exam Vitals reviewed.  Constitutional:      Appearance: Normal appearance.  HENT:     Head: Normocephalic.  Eyes:     Extraocular Movements: Extraocular movements intact.  Cardiovascular:     Rate and Rhythm: Normal rate.  Pulmonary:     Effort: Pulmonary effort is normal.  Abdominal:     Palpations: Abdomen is soft.     Tenderness: There is no abdominal tenderness.  Musculoskeletal:     Lumbar back: Spasms and tenderness present. No bony tenderness. Decreased range of motion. Negative right straight leg raise test and negative left straight leg raise test.  Skin:    General: Skin is warm and dry.  Neurological:     General: No focal deficit present.      Mental Status: He is alert and oriented to person, place, and time.     Sensory: No sensory deficit.     Motor: No weakness.   DG Lumbar Spine 2-3 Views Result Date: 01/30/2024 CLINICAL DATA:  Lower back pain x2 weeks. EXAM: LUMBAR SPINE - 2-3 VIEW COMPARISON:  None Available. FINDINGS: There is no evidence of lumbar spine fracture. Alignment is normal. Intervertebral disc spaces are maintained. A large stool burden is noted. IMPRESSION: Negative. Electronically Signed   By: Suzen Dials M.D.   On: 01/30/2024 11:10      ASSESSMENT & PLAN: Problem List Items Addressed This Visit       Musculoskeletal and Integument   Lumbar strain, initial encounter - Primary   Clinically stable.  No red flag signs or symptoms. Most likely secondary to exercise regimen Musculoskeletal pain Pain management discussed Unremarkable x-rays May need orthopedic evaluation if no better or worse during the next several weeks.      Relevant Orders   DG Lumbar Spine 2-3 Views (Completed)     Other   Lumbar pain   Pain management discussed Recommend Tylenol and or Advil as needed Clinically stable.  No red flag signs or symptoms Negative x-rays May need orthopedic evaluation and or physical therapy.      Relevant Orders   DG Lumbar Spine 2-3 Views (Completed)   Other Visit Diagnoses       Screening for STD (sexually transmitted disease)       Relevant Orders   GC/Chlamydia Probe Amp   RPR   HIV-1 RNA quant-no reflex-bld   Hepatitis C antibody      Patient Instructions  Acute Back Pain, Adult Acute back pain is sudden and usually short-lived. It is often caused by an injury to the muscles and tissues in the back. The injury may result from: A muscle, tendon, or ligament getting overstretched or torn. Ligaments are tissues that connect bones to each other. Lifting something improperly can cause a back strain. Wear and tear (degeneration) of the spinal disks. Spinal disks are circular  tissue that provide cushioning between the bones of the spine (vertebrae). Twisting motions, such as while playing sports or doing yard work. A hit to the back. Arthritis. You may have a physical exam, lab tests, and imaging tests to find the cause of your pain. Acute back pain usually goes away with rest and home care. Follow these instructions at home: Managing pain, stiffness, and swelling Take over-the-counter and prescription medicines only as told by your health care provider. Treatment may include medicines for pain and inflammation that are taken by mouth or applied to the skin, or muscle relaxants. Your health care provider  may recommend applying ice during the first 24-48 hours after your pain starts. To do this: Put ice in a plastic bag. Place a towel between your skin and the bag. Leave the ice on for 20 minutes, 2-3 times a day. Remove the ice if your skin turns bright red. This is very important. If you cannot feel pain, heat, or cold, you have a greater risk of damage to the area. If directed, apply heat to the affected area as often as told by your health care provider. Use the heat source that your health care provider recommends, such as a moist heat pack or a heating pad. Place a towel between your skin and the heat source. Leave the heat on for 20-30 minutes. Remove the heat if your skin turns bright red. This is especially important if you are unable to feel pain, heat, or cold. You have a greater risk of getting burned. Activity  Do not stay in bed. Staying in bed for more than 1-2 days can delay your recovery. Sit up and stand up straight. Avoid leaning forward when you sit or hunching over when you stand. If you work at a desk, sit close to it so you do not need to lean over. Keep your chin tucked in. Keep your neck drawn back, and keep your elbows bent at a 90-degree angle (right angle). Sit high and close to the steering wheel when you drive. Add lower back (lumbar)  support to your car seat, if needed. Take short walks on even surfaces as soon as you are able. Try to increase the length of time you walk each day. Do not sit, drive, or stand in one place for more than 30 minutes at a time. Sitting or standing for long periods of time can put stress on your back. Do not drive or use heavy machinery while taking prescription pain medicine. Use proper lifting techniques. When you bend and lift, use positions that put less stress on your back: [place] your knees. Keep the load close to your body. Avoid twisting. Exercise regularly as told by your health care provider. Exercising helps your back heal faster and helps prevent back injuries by keeping muscles strong and flexible. Work with a physical therapist to make a safe exercise program, as recommended by your health care provider. Do any exercises as told by your physical therapist. Lifestyle Maintain a healthy weight. Extra weight puts stress on your back and makes it difficult to have good posture. Avoid activities or situations that make you feel anxious or stressed. Stress and anxiety increase muscle tension and can make back pain worse. Learn ways to manage anxiety and stress, such as through exercise. General instructions Sleep on a firm mattress in a comfortable position. Try lying on your side with your knees slightly bent. If you lie on your back, put a pillow under your knees. Keep your head and neck in a straight line with your spine (neutral position) when using electronic equipment like smartphones or pads. To do this: Raise your smartphone or pad to look at it instead of bending your head or neck to look down. Put the smartphone or pad at the level of your face while looking at the screen. Follow your treatment plan as told by your health care provider. This may include: Cognitive or behavioral therapy. Acupuncture or massage therapy. Meditation or yoga. Contact a health care provider if: You  have pain that is not relieved with rest or medicine. You have increasing pain  going down into your legs or buttocks. Your pain does not improve after 2 weeks. You have pain at night. You lose weight without trying. You have a fever or chills. You develop nausea or vomiting. You develop abdominal pain. Get help right away if: You develop new bowel or bladder control problems. You have unusual weakness or numbness in your arms or legs. You feel faint. These symptoms may represent a serious problem that is an emergency. Do not wait to see if the symptoms will go away. Get medical help right away. Call your local emergency services (911 in the [place] Do not drive yourself to the hospital. Summary Acute back pain is sudden and usually short-lived. Use proper lifting techniques. When you bend and lift, use positions that put less stress on your back. Take over-the-counter and prescription medicines only as told by your health care provider, and apply heat or ice as told. This information is not intended to replace advice given to you by your health care provider. Make sure you discuss any questions you have with your health care provider. Document Revised: 07/31/2020 Document Reviewed: 07/31/2020 Elsevier Patient Education  2024 [employer]     Emil Schaumann, MD [facility]

## 2024-01-30 NOTE — Patient Instructions (Signed)
 Acute Back Pain, Adult Acute back pain is sudden and usually short-lived. It is often caused by an injury to the muscles and tissues in the back. The injury may result from: A muscle, tendon, or ligament getting overstretched or torn. Ligaments are tissues that connect bones to each other. Lifting something improperly can cause a back strain. Wear and tear (degeneration) of the spinal disks. Spinal disks are circular tissue that provide cushioning between the bones of the spine (vertebrae). Twisting motions, such as while playing sports or doing yard work. A hit to the back. Arthritis. You may have a physical exam, lab tests, and imaging tests to find the cause of your pain. Acute back pain usually goes away with rest and home care. Follow these instructions at home: Managing pain, stiffness, and swelling Take over-the-counter and prescription medicines only as told by your health care provider. Treatment may include medicines for pain and inflammation that are taken by mouth or applied to the skin, or muscle relaxants. Your health care provider may recommend applying ice during the first 24-48 hours after your pain starts. To do this: Put ice in a plastic bag. Place a towel between your skin and the bag. Leave the ice on for 20 minutes, 2-3 times a day. Remove the ice if your skin turns bright red. This is very important. If you cannot feel pain, heat, or cold, you have a greater risk of damage to the area. If directed, apply heat to the affected area as often as told by your health care provider. Use the heat source that your health care provider recommends, such as a moist heat pack or a heating pad. Place a towel between your skin and the heat source. Leave the heat on for 20-30 minutes. Remove the heat if your skin turns bright red. This is especially important if you are unable to feel pain, heat, or cold. You have a greater risk of getting burned. Activity  Do not stay in bed. Staying in  bed for more than 1-2 days can delay your recovery. Sit up and stand up straight. Avoid leaning forward when you sit or hunching over when you stand. If you work at a desk, sit close to it so you do not need to lean over. Keep your chin tucked in. Keep your neck drawn back, and keep your elbows bent at a 90-degree angle (right angle). Sit high and close to the steering wheel when you drive. Add lower back (lumbar) support to your car seat, if needed. Take short walks on even surfaces as soon as you are able. Try to increase the length of time you walk each day. Do not sit, drive, or stand in one place for more than 30 minutes at a time. Sitting or standing for long periods of time can put stress on your back. Do not drive or use heavy machinery while taking prescription pain medicine. Use proper lifting techniques. When you bend and lift, use positions that put less stress on your back: [place] your knees. Keep the load close to your body. Avoid twisting. Exercise regularly as told by your health care provider. Exercising helps your back heal faster and helps prevent back injuries by keeping muscles strong and flexible. Work with a physical therapist to make a safe exercise program, as recommended by your health care provider. Do any exercises as told by your physical therapist. Lifestyle Maintain a healthy weight. Extra weight puts stress on your back and makes it difficult to have good  posture. Avoid activities or situations that make you feel anxious or stressed. Stress and anxiety increase muscle tension and can make back pain worse. Learn ways to manage anxiety and stress, such as through exercise. General instructions Sleep on a firm mattress in a comfortable position. Try lying on your side with your knees slightly bent. If you lie on your back, put a pillow under your knees. Keep your head and neck in a straight line with your spine (neutral position) when using electronic equipment like  smartphones or pads. To do this: Raise your smartphone or pad to look at it instead of bending your head or neck to look down. Put the smartphone or pad at the level of your face while looking at the screen. Follow your treatment plan as told by your health care provider. This may include: Cognitive or behavioral therapy. Acupuncture or massage therapy. Meditation or yoga. Contact a health care provider if: You have pain that is not relieved with rest or medicine. You have increasing pain going down into your legs or buttocks. Your pain does not improve after 2 weeks. You have pain at night. You lose weight without trying. You have a fever or chills. You develop nausea or vomiting. You develop abdominal pain. Get help right away if: You develop new bowel or bladder control problems. You have unusual weakness or numbness in your arms or legs. You feel faint. These symptoms may represent a serious problem that is an emergency. Do not wait to see if the symptoms will go away. Get medical help right away. Call your local emergency services (911 in the [place] Do not drive yourself to the hospital. Summary Acute back pain is sudden and usually short-lived. Use proper lifting techniques. When you bend and lift, use positions that put less stress on your back. Take over-the-counter and prescription medicines only as told by your health care provider, and apply heat or ice as told. This information is not intended to replace advice given to you by your health care provider. Make sure you discuss any questions you have with your health care provider. Document Revised: 07/31/2020 Document Reviewed: 07/31/2020 Elsevier Patient Education  2024 [employer].

## 2024-01-31 NOTE — Telephone Encounter (Signed)
 Increase amount of fruits and vegetables in your diet plus daily fiber supplementation, Metamucil for example.

## 2024-02-01 LAB — GC/CHLAMYDIA PROBE AMP
Chlamydia trachomatis, NAA: NEGATIVE
Neisseria Gonorrhoeae by PCR: NEGATIVE

## 2024-03-22 ENCOUNTER — Encounter: Payer: Self-pay | Admitting: Emergency Medicine
